# Patient Record
Sex: Male | Born: 1955 | Race: White | Hispanic: No
Health system: Midwestern US, Community
[De-identification: ages and names within clinical notes are randomized; demographics above are authoritative.]

## PROBLEM LIST (undated history)

## (undated) DIAGNOSIS — E7800 Pure hypercholesterolemia, unspecified: Secondary | ICD-10-CM

## (undated) DIAGNOSIS — E119 Type 2 diabetes mellitus without complications: Secondary | ICD-10-CM

## (undated) DIAGNOSIS — I10 Essential (primary) hypertension: Secondary | ICD-10-CM

---

## 2016-04-20 ENCOUNTER — Inpatient Hospital Stay
Admit: 2016-04-20 | Discharge: 2016-04-20 | Disposition: A | Discharge status: Home / Self Care | Payer: PRIVATE HEALTH INSURANCE

## 2016-04-20 ENCOUNTER — Emergency Department: Admit: 2016-04-20 | Payer: PRIVATE HEALTH INSURANCE

## 2016-04-20 DIAGNOSIS — K922 Gastrointestinal hemorrhage, unspecified: Secondary | ICD-10-CM

## 2016-04-20 LAB — BASIC METABOLIC PANEL
BUN: 44 mg/dL — ABNORMAL HIGH (ref 7–22)
CO2: 23 meq/L (ref 23–33)
Calcium: 10.6 mg/dL — ABNORMAL HIGH (ref 8.5–10.5)
Chloride: 105 meq/L (ref 98–111)
Creatinine: 0.9 mg/dL (ref 0.4–1.2)
Glucose: 122 mg/dL — ABNORMAL HIGH (ref 70–108)
Potassium: 5.8 meq/L — ABNORMAL HIGH (ref 3.5–5.2)
Sodium: 142 meq/L (ref 135–145)

## 2016-04-20 LAB — URINE WITH REFLEXED MICRO
Bilirubin Urine: NEGATIVE
Blood, Urine: NEGATIVE
CASTS 2: NONE SEEN /lpf
Casts UA: NONE SEEN /lpf
Crystals, UA: NONE SEEN
Epithelial Cells, UA: NONE SEEN /hpf (ref 3–?)
Glucose, Ur: 250 mg/dl — AB
Ketones, Urine: 15 — AB
Leukocyte Esterase, Urine: NEGATIVE
MISCELLANEOUS 2: NONE SEEN
Nitrite, Urine: NEGATIVE
Protein, UA: NEGATIVE
Renal Epithelial, UA: NONE SEEN
Specific Gravity, Urine: >1.03 — AB (ref 1.002–1.03)
Urobilinogen, Urine: 0.2 eu/dl (ref 0.0–1.0)
WBC, UA: NONE SEEN /hpf (ref 0–?)
Yeast, UA: NONE SEEN
pH, UA: 6.5 (ref 5.0–9.0)

## 2016-04-20 LAB — CBC WITH AUTO DIFFERENTIAL
Basophils Absolute: 0.1 10*3/uL (ref 0.0–0.1)
Basophils: 0.9 %
Eosinophils Absolute: 0 10*3/uL (ref 0.0–0.4)
Eosinophils: 0.2 %
Hematocrit: 43.8 % (ref 42.0–52.0)
Hemoglobin: 14.5 gm/dl (ref 14.0–18.0)
Lymphocytes Absolute: 1.3 10*3/uL (ref 1.0–4.8)
Lymphocytes: 8.7 %
MCH: 28.4 pg (ref 27.0–31.0)
MCHC: 33.2 gm/dl (ref 33.0–37.0)
MCV: 85.4 fL (ref 80.0–94.0)
MPV: 8 mcm (ref 7.4–10.4)
Monocytes Absolute: 0.5 10*3/uL (ref 0.4–1.3)
Monocytes: 3.4 %
Platelets: 282 10*3/uL (ref 130–400)
RBC: 5.12 10*6/uL (ref 4.70–6.10)
RDW: 13.4 % (ref 11.5–14.5)
Seg Neutrophils: 86.8 %
Segs Absolute: 13.5 10*3/uL — ABNORMAL HIGH (ref 1.8–7.7)
WBC: 15.5 10*3/uL — ABNORMAL HIGH (ref 4.8–10.8)
nRBC: 0 /100 wbc

## 2016-04-20 LAB — HEPATIC FUNCTION PANEL
ALT: 19 U/L (ref 11–66)
AST: 19 U/L (ref 5–40)
Albumin: 5 g/dL (ref 3.5–5.1)
Alkaline Phosphatase: 79 U/L (ref 38–126)
Bilirubin, Direct: <0.2 mg/dL (ref 0.0–0.3)
Total Bilirubin: 0.9 mg/dL (ref 0.3–1.2)
Total Protein: 7.4 g/dL (ref 6.1–8.0)

## 2016-04-20 LAB — ANION GAP: Anion Gap: 14 meq/L (ref 8.0–16.0)

## 2016-04-20 LAB — BLOOD OCCULT STOOL SCREEN #1: Occult Blood Fecal: POSITIVE

## 2016-04-20 LAB — GLOMERULAR FILTRATION RATE, ESTIMATED: Est, Glom Filt Rate: 86 mL/min/{1.73_m2} — AB

## 2016-04-20 LAB — OSMOLALITY: Osmolality Calc: 295.6 mOsmol/kg (ref >=275.0)

## 2016-04-20 LAB — LIPASE: Lipase: 14.4 U/L (ref 5.6–51.3)

## 2016-04-20 MED ORDER — PANTOPRAZOLE SODIUM 40 MG PO TBEC
40 MG | Freq: Once | ORAL | Status: AC
Start: 2016-04-20 — End: 2016-04-20
  Administered 2016-04-20: 19:00:00 40 mg via ORAL

## 2016-04-20 MED ORDER — ONDANSETRON HCL 4 MG/2ML IJ SOLN
4 MG/2ML | Freq: Once | INTRAMUSCULAR | Status: AC
Start: 2016-04-20 — End: 2016-04-20
  Administered 2016-04-20: 19:00:00 4 mg via INTRAVENOUS

## 2016-04-20 MED ORDER — GI COCKTAIL
Freq: Once | Status: AC
Start: 2016-04-20 — End: 2016-04-20
  Administered 2016-04-20: 19:00:00 15 mL via ORAL

## 2016-04-20 MED ORDER — PANTOPRAZOLE SODIUM 20 MG PO TBEC
20 MG | ORAL_TABLET | Freq: Every day | ORAL | 0 refills | Status: AC
Start: 2016-04-20 — End: ?

## 2016-04-20 MED ORDER — SODIUM CHLORIDE 0.9 % IV SOLN
0.9 % | INTRAVENOUS | Status: DC
Start: 2016-04-20 — End: 2016-04-20
  Administered 2016-04-20: 21:00:00 via INTRAVENOUS

## 2016-04-20 MED ORDER — SODIUM CHLORIDE 0.9 % IV BOLUS
0.9 % | Freq: Once | INTRAVENOUS | Status: AC
Start: 2016-04-20 — End: 2016-04-20
  Administered 2016-04-20: 19:00:00 500 mL via INTRAVENOUS

## 2016-04-20 MED ORDER — ONDANSETRON 4 MG PO TBDP
4 MG | ORAL_TABLET | Freq: Three times a day (TID) | ORAL | 0 refills | Status: AC | PRN
Start: 2016-04-20 — End: ?

## 2016-04-20 MED ORDER — IOPAMIDOL 76 % IV SOLN
76 % | Freq: Once | INTRAVENOUS | Status: AC | PRN
Start: 2016-04-20 — End: 2016-04-20
  Administered 2016-04-20: 21:00:00 85 mL via INTRAVENOUS

## 2016-04-20 MED ORDER — SUCRALFATE 1 GM/10ML PO SUSP
1 GM/0ML | Freq: Four times a day (QID) | ORAL | 0 refills | Status: AC
Start: 2016-04-20 — End: ?

## 2016-04-20 MED FILL — ONDANSETRON HCL 4 MG/2ML IJ SOLN: 4 MG/2ML | INTRAMUSCULAR | Qty: 2

## 2016-04-20 MED FILL — GI COCKTAIL: Qty: 15

## 2016-04-20 MED FILL — PANTOPRAZOLE SODIUM 40 MG PO TBEC: 40 MG | ORAL | Qty: 1

## 2016-04-20 NOTE — ED Provider Notes (Signed)
[facility]  eMERGENCY dEPARTMENT eNCOUnter          CHIEF COMPLAINT       Chief Complaint   Patient presents with   ??? Other     dark stool, "feeling poorly", vomiting        Nurses Notes reviewed and I agree except as noted in the HPI.    HISTORY OF PRESENT ILLNESS    Chad Frazier is a 60 y.o. male who presents to the Emergency Department via EMS transport for the evaluation of nausea, vomiting, and melena. Patient states that he awoke this morning at 3:00 am "feeling poorly," and then began to experience diarrhea. He states that his diarrhea was initially normal in color, but began to get dark in color as it progressed. He states that he was also nauseous this morning, and that he vomited bright red blood. He also reports that his blood pressure was 90/60 earlier today. Patient denies experiencing any chest pain, shortness of breath, abdominal pain, fever, chills, lightheadedness, or dizziness. He reports that he has a history of hypertension, hemorrhoids, polypectomy in 2000, and that his last colonoscopy was in 2002. He states that he has a family history of colon cancer, and that he regularly takes Advil and tylenol. He denies any alcohol use, stating that he has been in jail for 32 years and does not have access to alcohol. He denies being on any blood thinners or a history of stomach ulcers. Patient denies further complaints at initial encounter.    Location/Symptom: Diarrhea, melena, and hematemesis.  Timing/Onset: 3:00 am  Context/Setting: Patient reports that he regularly takes Advil and tylenol, and has a history of hemorrhoids and polypectomy.    The HPI was provided by the patient.      REVIEW OF SYSTEMS     Review of Systems   Constitutional: Negative for appetite change, chills, fatigue and fever.   HENT: Negative for congestion, ear pain, rhinorrhea and sore throat.    Eyes: Negative for discharge, redness and visual disturbance.   Respiratory: Negative for cough, shortness of  breath and wheezing.    Cardiovascular: Negative for chest pain, palpitations and leg swelling.   Gastrointestinal: Positive for diarrhea, nausea and vomiting (hematemesis). Negative for abdominal pain, blood in stool, constipation and rectal pain.        Melena   Genitourinary: Negative for decreased urine volume, difficulty urinating, dysuria and hematuria.   Musculoskeletal: Negative for arthralgias, back pain, joint swelling and neck pain.   Skin: Negative for pallor and rash.   Allergic/Immunologic: Negative for environmental allergies.   Neurological: Negative for dizziness, syncope, weakness, light-headedness, numbness and headaches.   Hematological: Negative for adenopathy.   Psychiatric/Behavioral: Negative for agitation, confusion, dysphoric mood and suicidal ideas. The patient is not nervous/anxious.        PAST MEDICAL HISTORY    has a past medical history of Hypertension.    SURGICAL HISTORY      has a past surgical history that includes polypectomy (2000).    CURRENT MEDICATIONS       Discharge Medication List as of 04/20/2016  8:28 PM      CONTINUE these medications which have NOT CHANGED    Details   AmLODIPine Besylate (NORVASC PO) Take by mouthHistorical Med             ALLERGIES     has No Known Allergies.    FAMILY HISTORY     has no family status  information on file.    family history is not on file.    SOCIAL HISTORY      reports that he has quit smoking. He does not have any smokeless tobacco history on file. He reports that he does not drink alcohol or use drugs.    PHYSICAL EXAM     INITIAL VITALS:  height is 5\' 9"  (1.753 m) and weight is 182 lb (82.6 kg). His oral temperature is 97.7 ??F (36.5 ??C). His blood pressure is 114/77 and his pulse is 80. His respiration is 18 and oxygen saturation is 100%.    Physical Exam   Constitutional: He is oriented to person, place, and time. Vital signs are normal. He appears well-developed and well-nourished. He is active and cooperative.  Non-toxic  appearance. He does not have a sickly appearance. He does not appear ill.   HENT:   Head: Normocephalic and atraumatic.   Right Ear: External ear normal.   Left Ear: External ear normal.   Eyes: Conjunctivae and EOM are normal. Right eye exhibits no discharge. Left eye exhibits no discharge. No scleral icterus.   Neck: Normal range of motion. Neck supple. No JVD present. No tracheal deviation present.   Cardiovascular: Normal rate, normal heart sounds, intact distal pulses and normal pulses.  Exam reveals no gallop and no friction rub.    No murmur heard.  Pulses:       Radial pulses are 2+ on the right side, and 2+ on the left side.   Pulmonary/Chest: Effort normal and breath sounds normal. No accessory muscle usage or stridor. No respiratory distress. He has no decreased breath sounds. He has no wheezes. He has no rhonchi. He has no rales.   Abdominal: Soft. Normal appearance. He exhibits no distension. There is no tenderness. There is no rigidity, no rebound and no guarding. No hernia. Hernia confirmed negative in the right inguinal area and confirmed negative in the left inguinal area.   Genitourinary: Prostate normal. Rectal exam shows external hemorrhoid (moderate, non-thrombosed, non-tender external hemorrhoid noted) and guaiac positive stool. Rectal exam shows no internal hemorrhoid, no fissure, no mass, no tenderness and anal tone normal. Prostate is not enlarged and not tender.         Musculoskeletal: Normal range of motion. He exhibits no edema.   Lymphadenopathy:        Right: No inguinal adenopathy present.        Left: No inguinal adenopathy present.   Neurological: He is alert and oriented to person, place, and time. He is not disoriented. No sensory deficit. He exhibits normal muscle tone. He displays no seizure activity. GCS eye subscore is 4. GCS verbal subscore is 5. GCS motor subscore is 6.   Skin: Skin is warm and dry. No rash noted. He is not diaphoretic.   Psychiatric: He has a normal mood  and affect. His speech is normal and behavior is normal. Thought content normal.   Nursing note and vitals reviewed.      DIFFERENTIAL DIAGNOSIS:   Upper GI bleed, viral gastroenteritis, hemorrhoids    DIAGNOSTIC RESULTS     EKG: All EKG's are interpreted by the Emergency Department Physician who either signs or Co-signs this chart in the absence of a cardiologist.    None    RADIOLOGY: non-plain film images(s) such as CT, Ultrasound and MRI are read by the radiologist.    CT ABDOMEN PELVIS W IV CONTRAST Additional Contrast? Oral (4 dose)   Final Result  NO CT EVIDENCE OF ACUTE ABNORMALITY.      INCIDENTAL FINDINGS AS DISCUSSED.                  **This report has been created using voice recognition software. It may contain minor errors which are inherent in voice recognition technology.**            Final report electronically signed by Dr. Arlys JohnKevin Killough on 04/20/2016 4:11 PM          LABS:     Labs Reviewed   CBC WITH AUTO DIFFERENTIAL - Abnormal; Notable for the following:        Result Value    WBC 15.5 (*)     Segs Absolute 13.5 (*)     All other components within normal limits   BASIC METABOLIC PANEL - Abnormal; Notable for the following:     Potassium 5.8 (*)     Glucose 122 (*)     BUN 44 (*)     Calcium 10.6 (*)     All other components within normal limits   GLOMERULAR FILTRATION RATE, ESTIMATED - Abnormal; Notable for the following:     Est, Glom Filt Rate 86 (*)     All other components within normal limits   URINE WITH REFLEXED MICRO - Abnormal; Notable for the following:     Glucose, Ur 250 (*)     Ketones, Urine 15 (*)     Specific Gravity, Urine > 1.030 (*)     All other components within normal limits   BLOOD OCCULT STOOL SCREEN #1   HEPATIC FUNCTION PANEL   LIPASE   ANION GAP   OSMOLALITY       EMERGENCY DEPARTMENT COURSE:   Vitals:    Vitals:    04/20/16 1307 04/20/16 1448 04/20/16 1603   BP: 126/80 119/84 114/77   Pulse: 83 91 80   Resp: 18 18 18    Temp: 97.7 ??F (36.5 ??C)     TempSrc: Oral      SpO2: 100% 100% 100%   Weight: 182 lb (82.6 kg)     Height: 5\' 9"  (1.753 m)       Patient is an inmate, presents for complaints of hematemesis and melena without abdominal pain. Symptoms began today. He does take NSAIDs frequently. Last colonoscopy 15 years ago. He has positive family history of colon cancer. No hematemesis in the department. No stools. Guaiac positive. Hemoglobin 14.5. Vitals reassuring, BP and HR stable.  WBC slightly elevated at 15.5. Calcium elevated. CT without abnormality. In the department, patient was treated with IV fluids, zofran, GI cocktail and protonix. No signs of hemorrhage in the department. He appears stable for discharge and reasons for which to return discussed with patient and guards at bedside. Emphasized importance of endoscopy regarding family history. Advised cessation of NSAID use.  Patient was agreeable with this plan, denied any further needs upon discharge.    CRITICAL CARE:   None     CONSULTS:  None    PROCEDURES:  None    FINAL IMPRESSION      1. Upper GI bleed          DISPOSITION/PLAN   Discharge.    PATIENT REFERRED TO:  Gastroenterology    Call in 1 day  to schedule endoscopy/colonoscopy    [facility]  [address]  [place] [id number]  [phone]    If symptoms worsen, including uncontrolled bleeding, worsening pain, fever, lightheadedness,  syncope, palpitations      DISCHARGE MEDICATIONS:  Discharge Medication List as of 04/20/2016  8:28 PM      START taking these medications    Details   ondansetron (ZOFRAN ODT) 4 MG disintegrating tablet Take 1 tablet by mouth every 8 hours as needed for Nausea or Vomiting, Disp-20 tablet, R-0Print      pantoprazole (PROTONIX) 20 MG tablet Take 1 tablet by mouth daily, Disp-30 tablet, R-0Print      sucralfate (CARAFATE) 1 GM/10ML suspension Take 10 mLs by mouth 4 times daily, Disp-240 mL, R-0Print             (Please note that portions of this note were completed with a voice recognition program.  Efforts  were made to edit the dictations but occasionally words are mis-transcribed.)    The patient was given an opportunity to see the Emergency Attending. The patient voiced understanding that I was a Corporate treasurer and was in agreement with being seen independently by myself.     Scribe:  Jama Flavors 04/20/16 1:22 PM Scribing for and in the presence of [employer], PA-C.    Signed by: Jama Flavors, Scribe, 04/29/16 6:07 PM    Provider:  I personally performed the services described in the documentation, reviewed and edited the documentation which was dictated to the scribe in my presence, and it accurately records my words and actions.    Elgie Collard, PA-C 04/20/16 6:07 PM        Ramiro Harvest, PA-C  04/29/16 [phone]

## 2016-04-20 NOTE — ED Notes (Signed)
Bed: 016A  Expected date: 04/20/16  Expected time:   Means of arrival:   Comments:  LACP/[place]/dark stool       Demaris D [facility]  04/20/16 1252

## 2016-04-20 NOTE — Discharge Instructions (Signed)
STOP taking all NSAIDs

## 2016-04-20 NOTE — ED Triage Notes (Signed)
Patient arrived to room 16 with c/o dark stool, "feeling poorly" and vomiting. Patient stated this has been going on for awhile now but the dark stools is new. Patient stated he has episodes of diarrhea and the last time was dark in color.

## 2020-11-05 ENCOUNTER — Other Ambulatory Visit: Payer: Self-pay

## 2020-11-05 ENCOUNTER — Encounter (HOSPITAL_COMMUNITY): Payer: Self-pay | Admitting: Emergency Medicine

## 2020-11-05 ENCOUNTER — Emergency Department (HOSPITAL_COMMUNITY)
Admission: EM | Admit: 2020-11-05 | Discharge: 2020-11-05 | Disposition: A | Discharge status: Home / Self Care | Payer: No Typology Code available for payment source | Attending: Emergency Medicine | Admitting: Emergency Medicine

## 2020-11-05 ENCOUNTER — Emergency Department (HOSPITAL_COMMUNITY): Payer: No Typology Code available for payment source

## 2020-11-05 DIAGNOSIS — E119 Type 2 diabetes mellitus without complications: Secondary | ICD-10-CM | POA: Insufficient documentation

## 2020-11-05 DIAGNOSIS — S51811A Laceration without foreign body of right forearm, initial encounter: Secondary | ICD-10-CM | POA: Diagnosis not present

## 2020-11-05 DIAGNOSIS — W25XXXA Contact with sharp glass, initial encounter: Secondary | ICD-10-CM | POA: Insufficient documentation

## 2020-11-05 DIAGNOSIS — I10 Essential (primary) hypertension: Secondary | ICD-10-CM | POA: Diagnosis not present

## 2020-11-05 DIAGNOSIS — S41111A Laceration without foreign body of right upper arm, initial encounter: Secondary | ICD-10-CM

## 2020-11-05 DIAGNOSIS — S59911A Unspecified injury of right forearm, initial encounter: Secondary | ICD-10-CM | POA: Diagnosis present

## 2020-11-05 DIAGNOSIS — Z23 Encounter for immunization: Secondary | ICD-10-CM | POA: Diagnosis not present

## 2020-11-05 HISTORY — DX: Essential (primary) hypertension: I10

## 2020-11-05 HISTORY — DX: Pure hypercholesterolemia, unspecified: E78.00

## 2020-11-05 HISTORY — DX: Type 2 diabetes mellitus without complications: E11.9

## 2020-11-05 MED ORDER — TETANUS-DIPHTH-ACELL PERTUSSIS 5-2.5-18.5 LF-MCG/0.5 IM SUSY
0.5000 mL | PREFILLED_SYRINGE | Freq: Once | INTRAMUSCULAR | Status: AC
  Administered 2020-11-05: 0.5 mL via INTRAMUSCULAR
  Filled 2020-11-05: qty 0.5

## 2020-11-05 MED ORDER — LIDOCAINE HCL (PF) 1 % IJ SOLN
10.0000 mL | Freq: Once | INTRAMUSCULAR | Status: AC
  Administered 2020-11-05: 10 mL via INTRADERMAL
  Filled 2020-11-05: qty 10

## 2020-11-05 NOTE — ED Notes (Signed)
Right upper arm wound irrigated with of sterile water.

## 2020-11-05 NOTE — ED Triage Notes (Signed)
Pt reports window breaking and glass cutting arm open, about 3 inch lac to inner rt ac. Pt denies lightheadedness, PMS intact. Bleeding controlled in triage with abd pad and ace wrap.

## 2020-11-05 NOTE — ED Notes (Signed)
Patient is resting comfortably. 

## 2020-11-05 NOTE — ED Provider Notes (Signed)
Emergency Medicine Provider Triage Evaluation Note  Mike Wiggins , a 65 y.o. male  was evaluated in triage.  Pt complains of right arm injury that occurred just prior to arrival.  Sustained a laceration with glass.  Denies any numbness, weakness, pain with range of motion, anticoagulant use, lightheadedness or dizziness.  Review of Systems  Positive: Right arm laceration Negative: Numbness, dizziness, lightheadedness  Physical Exam  BP 120/90 (BP Location: Left Arm)   Pulse 68   Temp 98 F (36.7 C) (Oral)   Resp 16   SpO2 100%  Gen:   Awake, no distress Resp:  Normal effort MSK:   Moves extremities without difficulty Other:  Laceration noted to right forearm area.  There appears to be bleeding but this does not appear pulsatile.  2+ radial pulse noted.  Normal sensation to light touch.  Normal range of motion of right arm  Medical Decision Making  Medically screening exam initiated at 4:04 PM.  Appropriate orders placed.  Everardo Pacific was informed that the remainder of the evaluation will be completed by another provider, this initial triage assessment does not replace that evaluation, and the importance of remaining in the ED until their evaluation is complete.  Will require imaging and repair   Dietrich Pates, PA-C 11/05/20 1606    Margarita Grizzle, MD 11/09/20 [phone]

## 2020-11-05 NOTE — Discharge Instructions (Signed)
Please schedule an appt with your primary care doctor to have the stitches removed in 5-7 days.

## 2020-11-05 NOTE — ED Provider Notes (Signed)
[facility] EMERGENCY DEPARTMENT Provider Note   CSN: [id number] Arrival date & time: 11/05/20  1551     History Chief Complaint  Patient presents with  . Extremity Laceration    Mike Wiggins is a 65 y.o. male.  The history is provided by the patient.  Laceration Location:  Shoulder/arm Shoulder/arm laceration location:  R forearm Length:  8cm Depth:  Through dermis Quality: straight   Bleeding: venous   Time since incident:  2 hours Laceration mechanism:  Broken glass Pain details:    Quality:  Aching   Severity:  Mild   Timing:  Constant   Progression:  Unchanged Foreign body present:  No foreign bodies Relieved by:  None tried Worsened by:  Nothing Ineffective treatments:  None tried Tetanus status:  Out of date Associated symptoms: no fever, no focal weakness, no numbness, no rash, no redness, no swelling and no streaking        Past Medical History:  Diagnosis Date  . Diabetes mellitus without complication (HCC)   . Hypercholesteremia   . Hypertension     There are no problems to display for this patient.   History reviewed. No pertinent surgical history.     No family history on file.  Social History   Tobacco Use  . Smoking status: Never Smoker  . Smokeless tobacco: Never Used  Substance Use Topics  . Alcohol use: Never  . Drug use: Never    Home Medications Prior to Admission medications   Not on File    Allergies    Patient has no known allergies.  Review of Systems   Review of Systems  Constitutional: Negative for chills and fever.  HENT: Negative for ear pain and sore throat.   Eyes: Negative for pain and visual disturbance.  Respiratory: Negative for cough and shortness of breath.   Cardiovascular: Negative for chest pain and palpitations.  Gastrointestinal: Negative for abdominal pain and vomiting.  Genitourinary: Negative for dysuria and hematuria.  Musculoskeletal: Negative for arthralgias and  back pain.  Skin: Positive for wound. Negative for color change and rash.  Neurological: Negative for focal weakness, seizures and syncope.  All other systems reviewed and are negative.   Physical Exam Updated Vital Signs BP 130/87   Pulse 65   Temp 98.2 F (36.8 C)   Resp 16   Ht 5\' 9"  (1.753 m)   Wt 77.1 kg   SpO2 100%   BMI 25.10 kg/m   Physical Exam Vitals and nursing note reviewed.  Constitutional:      Appearance: He is well-developed.  HENT:     Head: Normocephalic and atraumatic.  Eyes:     Conjunctiva/sclera: Conjunctivae normal.  Cardiovascular:     Rate and Rhythm: Normal rate and regular rhythm.     Pulses:          Radial pulses are 2+ on the right side and 2+ on the left side.     Heart sounds: No murmur heard.   Pulmonary:     Effort: Pulmonary effort is normal. No respiratory distress.     Breath sounds: Normal breath sounds.  Abdominal:     Palpations: Abdomen is soft.     Tenderness: There is no abdominal tenderness.  Musculoskeletal:     Cervical back: Neck supple.     Comments:  LUE: Full ROM at the shoulder, elbow and wrist. Sensation about the lower arm and hand wnl. Motor function intact.  Skin:    General:  Skin is warm and dry.       Neurological:     Mental Status: He is alert.     ED Results / Procedures / Treatments   Labs (all labs ordered are listed, but only abnormal results are displayed) Labs Reviewed - No data to display  EKG None  Radiology DG Forearm Right  Result Date: 11/05/2020 CLINICAL DATA:  Laceration to medial elbow. EXAM: RIGHT FOREARM - 2 VIEW COMPARISON:  No comparison studies available. FINDINGS: There is no evidence of fracture or other focal bone lesions. Soft tissues are unremarkable. IMPRESSION: Negative. Electronically Signed   By: Kennith Center M.D.   On: 11/05/2020 16:31    Procedures .Marland KitchenLaceration Repair  Date/Time: 11/05/2020 7:04 PM Performed by: Ardeen Fillers, DO Authorized by:  Benjiman Core, MD   Consent:    Consent obtained:  Verbal   Consent given by:  Patient   Risks, benefits, and alternatives were discussed: yes     Risks discussed:  Infection, need for additional repair, nerve damage, pain, vascular damage, poor cosmetic result and poor wound healing   Alternatives discussed:  Delayed treatment, observation and no treatment Universal protocol:    Patient identity confirmed:  Verbally with patient and arm band Anesthesia:    Anesthesia method:  Local infiltration   Local anesthetic:  Lidocaine 1% w/o epi Laceration details:    Location:  Shoulder/arm   Shoulder/arm location:  R lower arm   Length (cm):  8   Depth (mm):  3 Pre-procedure details:    Preparation:  Patient was prepped and draped in usual sterile fashion and imaging obtained to evaluate for foreign bodies Exploration:    Limited defect created (wound extended): no     Hemostasis achieved with:  Direct pressure   Imaging obtained: x-ray     Imaging outcome: foreign body not noted     Wound exploration: wound explored through full range of motion and entire depth of wound visualized     Wound extent: no nerve damage noted, no tendon damage noted, no underlying fracture noted and no vascular damage noted     Contaminated: no   Treatment:    Area cleansed with:  Saline   Amount of cleaning:  Extensive   Irrigation solution:  Sterile saline   Irrigation volume:  500   Irrigation method:  Syringe   Visualized foreign bodies/material removed: no     Debridement:  None   Undermining:  None Skin repair:    Repair method:  Sutures   Suture size:  4-0   Suture material:  Prolene   Suture technique:  Horizontal mattress and simple interrupted   Number of sutures:  6 Approximation:    Approximation:  Close Repair type:    Repair type:  Simple Post-procedure details:    Dressing:  Antibiotic ointment and bulky dressing   Procedure completion:  Tolerated well, no immediate  complications     Medications Ordered in ED Medications  lidocaine (PF) (XYLOCAINE) 1 % injection 10 mL (10 mLs Intradermal Given 11/05/20 1753)  Tdap (BOOSTRIX) injection 0.5 mL (0.5 mLs Intramuscular Given 11/05/20 1752)    ED Course  I have reviewed the triage vital signs and the nursing notes.  Pertinent labs & imaging results that were available during my care of the patient were reviewed by me and considered in my medical decision making (see chart for details).    MDM Rules/Calculators/A&P  Impression is laceration of the right upper extremity.  Though considered, have a low neck suspicion for associated musculoskeletal injury.  Neurovascularly intact distal to the injury.  Doubt nerve or large vessel injury. Tetanus was updated. Laceration was repaired per procedure note above.  He was advised to schedule an appointment with his primary care doctor within the next 5-7 days for wound check and suture removal. I gave him strict return precautions including indications that would suggest infection. Discharged in good condition.  Final Clinical Impression(s) / ED Diagnoses Final diagnoses:  Laceration of right upper extremity, initial encounter    Rx / DC Orders ED Discharge Orders    None       Ardeen Fillers, DO 11/06/20 1025    Benjiman Core, MD 11/07/20 0003

## 2021-11-20 IMAGING — CR DG FOREARM 2V*R*
2 series · 2 of 2 positions shown · non-contrast
Comparison: No comparison studies available.

CLINICAL DATA: Laceration to medial elbow.

EXAM:
RIGHT FOREARM - 2 VIEW

[forearm ap]
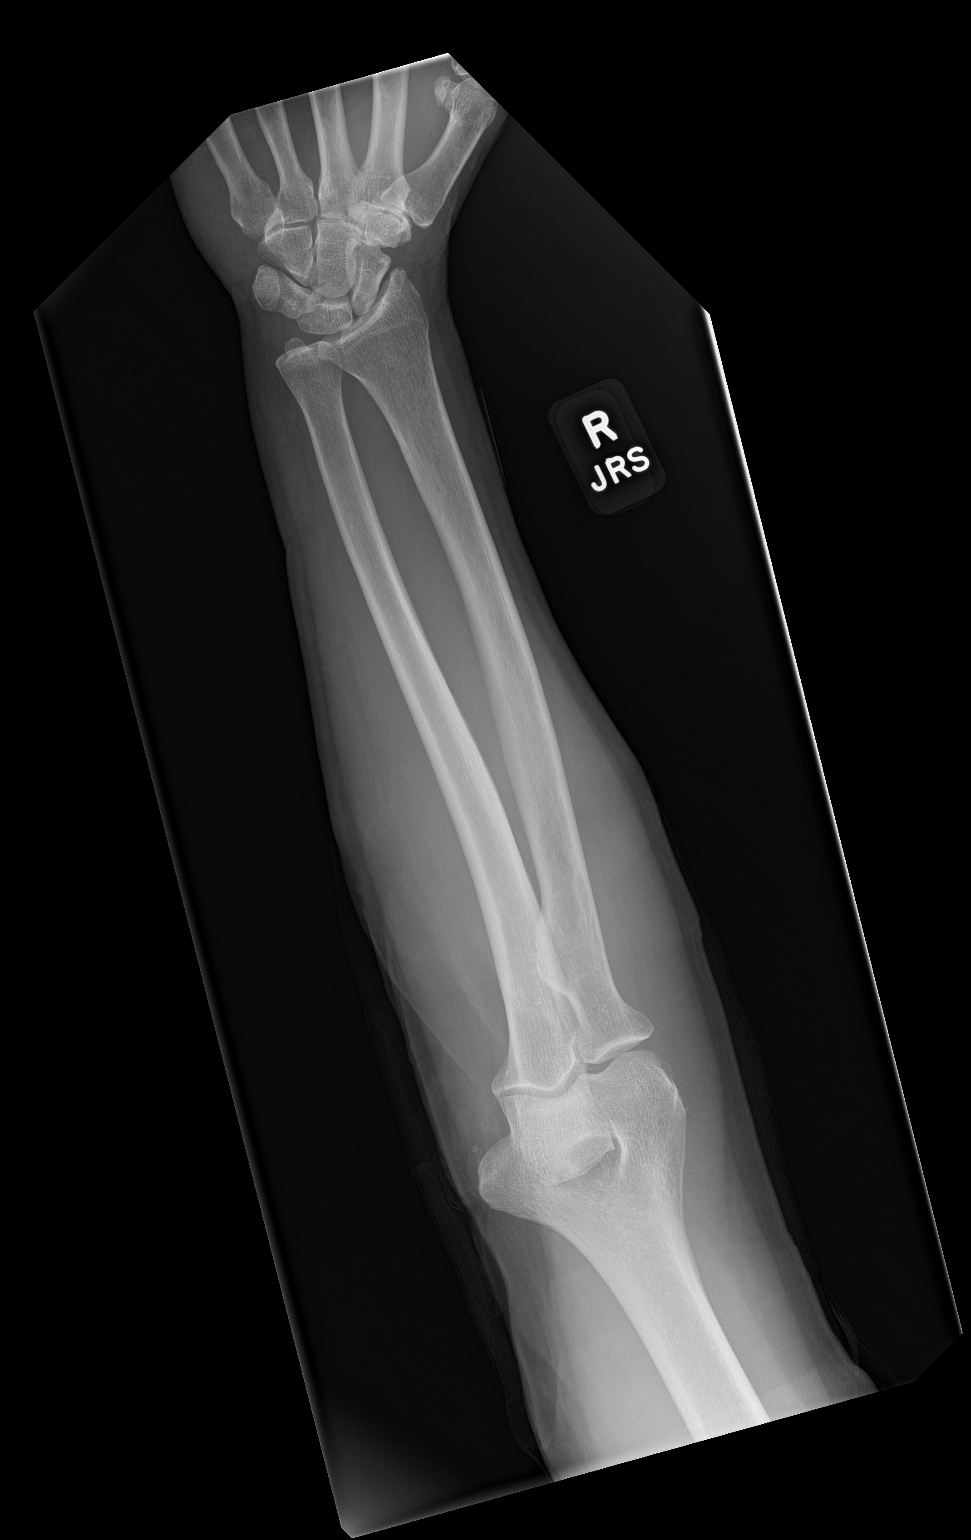

[forearm lat]
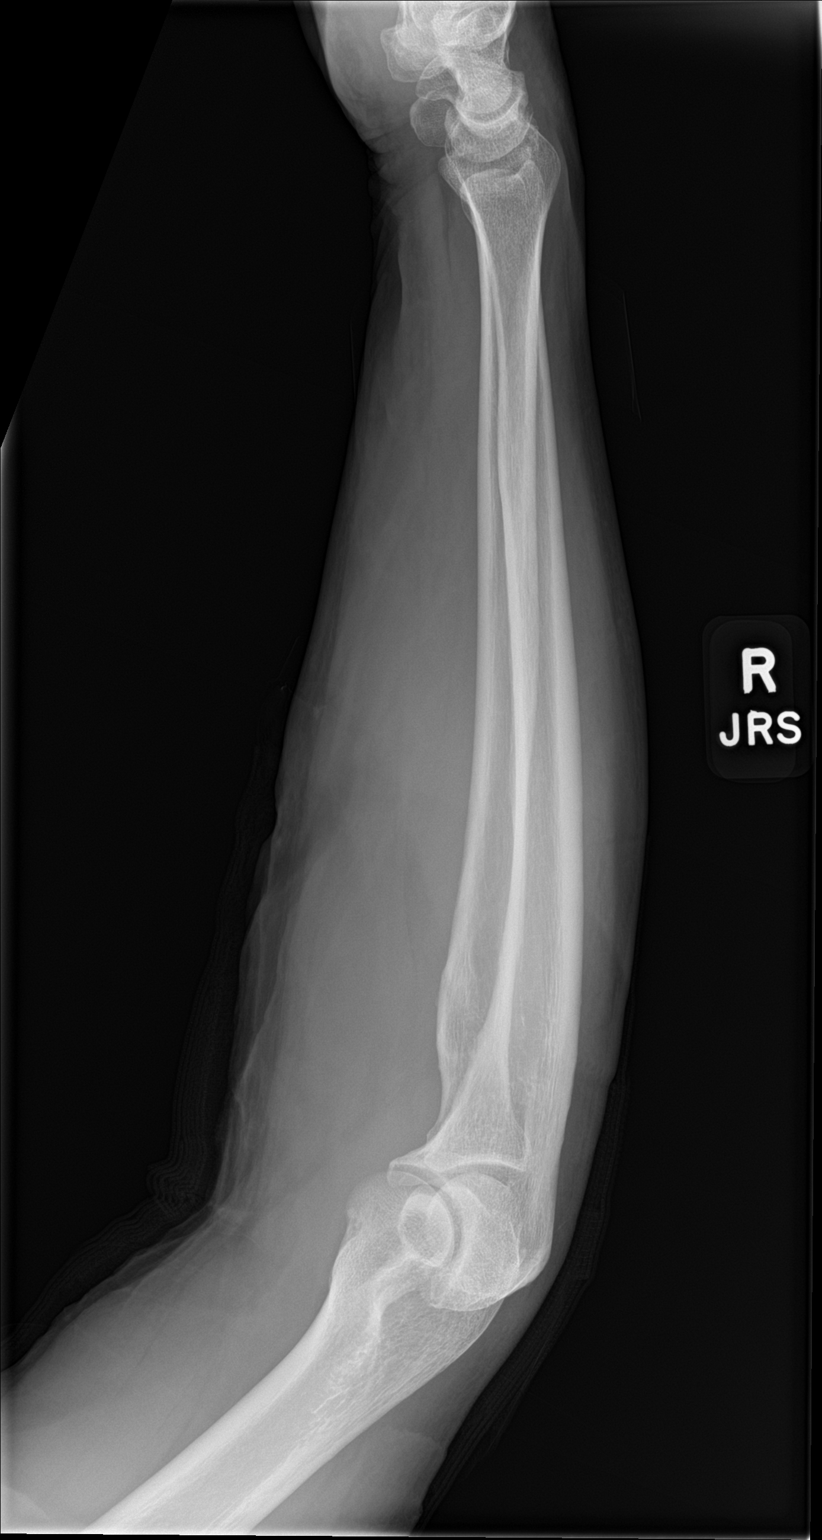

[2 of 2 positions shown; findings below may reference images not displayed]

FINDINGS: There is no evidence of fracture or other focal bone lesions. Soft
tissues are unremarkable.
IMPRESSION: Negative.
